# Patient Record
Sex: Female | Born: 1935 | Race: White | Hispanic: No | Marital: Single | Smoking: Never smoker
Health system: Southern US, Community
[De-identification: ages and names within clinical notes are randomized; demographics above are authoritative.]

## PROBLEM LIST (undated history)

## (undated) DIAGNOSIS — E785 Hyperlipidemia, unspecified: Secondary | ICD-10-CM

## (undated) DIAGNOSIS — R609 Edema, unspecified: Secondary | ICD-10-CM

## (undated) DIAGNOSIS — I10 Essential (primary) hypertension: Secondary | ICD-10-CM

---

## 2018-12-30 ENCOUNTER — Ambulatory Visit
Admission: EM | Admit: 2018-12-30 | Discharge: 2018-12-30 | Disposition: A | Discharge status: Home / Self Care | Payer: Medicare Other | Attending: Family Medicine | Admitting: Family Medicine

## 2018-12-30 ENCOUNTER — Ambulatory Visit: Payer: Medicare Other

## 2018-12-30 DIAGNOSIS — S8001XA Contusion of right knee, initial encounter: Secondary | ICD-10-CM | POA: Insufficient documentation

## 2018-12-30 DIAGNOSIS — S8011XA Contusion of right lower leg, initial encounter: Secondary | ICD-10-CM

## 2018-12-30 DIAGNOSIS — W19XXXA Unspecified fall, initial encounter: Secondary | ICD-10-CM | POA: Insufficient documentation

## 2018-12-30 DIAGNOSIS — E785 Hyperlipidemia, unspecified: Secondary | ICD-10-CM | POA: Insufficient documentation

## 2018-12-30 DIAGNOSIS — M11261 Other chondrocalcinosis, right knee: Secondary | ICD-10-CM | POA: Diagnosis not present

## 2018-12-30 DIAGNOSIS — I10 Essential (primary) hypertension: Secondary | ICD-10-CM | POA: Diagnosis not present

## 2018-12-30 DIAGNOSIS — Z8249 Family history of ischemic heart disease and other diseases of the circulatory system: Secondary | ICD-10-CM | POA: Diagnosis not present

## 2018-12-30 DIAGNOSIS — Z79899 Other long term (current) drug therapy: Secondary | ICD-10-CM | POA: Diagnosis not present

## 2018-12-30 DIAGNOSIS — Z7982 Long term (current) use of aspirin: Secondary | ICD-10-CM | POA: Insufficient documentation

## 2018-12-30 DIAGNOSIS — M25561 Pain in right knee: Secondary | ICD-10-CM | POA: Diagnosis present

## 2018-12-30 DIAGNOSIS — M160 Bilateral primary osteoarthritis of hip: Secondary | ICD-10-CM | POA: Insufficient documentation

## 2018-12-30 HISTORY — DX: Edema, unspecified: R60.9

## 2018-12-30 HISTORY — DX: Essential (primary) hypertension: I10

## 2018-12-30 HISTORY — DX: Hyperlipidemia, unspecified: E78.5

## 2018-12-30 MED ORDER — HYDROCODONE-ACETAMINOPHEN 5-325 MG PO TABS: ORAL_TABLET | ORAL | 0 refills | Status: AC

## 2018-12-30 NOTE — ED Provider Notes (Signed)
[facility]    CSN: [id number] Arrival date & time: 12/30/18  1119     History   Chief Complaint Chief Complaint  Patient presents with  . Fall    appt    HPI Amanda Moreno is a 85 y.o. female.   85 yo female with a c/o right knee and hip pain since falling at home 3 days ago. States she got up from her chair and walked up 3 steps and fell landing on her right side. Denies any loss of consciousness, hitting her head, palpitations, numbness/weakness, fevers, chills, chest pains.   The history is provided by the patient.    Past Medical History:  Diagnosis Date  . Edema   . Hyperlipemia   . Hypertension     There are no active problems to display for this patient.   History reviewed. No pertinent surgical history.  OB History   No obstetric history on file.      Home Medications    Prior to Admission medications   Medication Sig Start Date End Date Taking? Authorizing Provider  aspirin EC 81 MG tablet Take by mouth.   Yes [provider]  hydrochlorothiazide (HYDRODIURIL) 12.5 MG tablet TK 1 T PO ONCE A DAY 12/07/18  Yes [provider]  lisinopril (ZESTRIL) 10 MG tablet TK 1 T PO ONCE D 12/07/18  Yes [provider]  simvastatin (ZOCOR) 40 MG tablet TK 1 T PO QHS 12/18/18  Yes [provider]  HYDROcodone-acetaminophen (NORCO/VICODIN) 5-325 MG tablet 1-2 tabs po bid prn 12/30/18   Payton Mccallum, MD    Family History Family History  Problem Relation Age of Onset  . Hypertension Mother   . Hypertension Father     Social History Social History   Tobacco Use  . Smoking status: Never Smoker  . Smokeless tobacco: Never Used  Substance Use Topics  . Alcohol use: Never    Frequency: Never  . Drug use: Not on file     Allergies   Patient has no known allergies.   Review of Systems Review of Systems   Physical Exam Triage Vital Signs ED Triage Vitals  Enc Vitals Group     BP 12/30/18 1146 (!) 174/71    Pulse Rate 12/30/18 1145 78     Resp 12/30/18 1145 18     Temp 12/30/18 1145 98.2 F (36.8 C)     Temp Source 12/30/18 1145 Oral     SpO2 12/30/18 1145 99 %     Weight --      Height 12/30/18 1147 4\' 11"  (1.499 m)     Head Circumference --      Peak Flow --      Pain Score 12/30/18 1147 5     Pain Loc --      Pain Edu? --      Excl. in GC? --    No data found.  Updated Vital Signs BP (!) 174/71   Pulse 78   Temp 98.2 F (36.8 C) (Oral)   Resp 18   Ht 4\' 11"  (1.499 m)   SpO2 99%   Visual Acuity Right Eye Distance:   Left Eye Distance:   Bilateral Distance:    Right Eye Near:   Left Eye Near:    Bilateral Near:     Physical Exam Vitals signs and nursing note reviewed.  Constitutional:      General: She is not in acute distress.    Appearance: She is not  toxic-appearing or diaphoretic.  Musculoskeletal:     Right hip: She exhibits tenderness (diffuse).     Right knee: She exhibits swelling (mild). Tenderness (diffuse) found.  Neurological:     Mental Status: She is alert.      UC Treatments / Results  Labs (all labs ordered are listed, but only abnormal results are displayed) Labs Reviewed - No data to display  EKG None  Radiology Dg Knee Complete 4 Views Right  Result Date: 12/30/2018 CLINICAL DATA:  Recent fall.  Right knee pain. EXAM: RIGHT KNEE - COMPLETE 4+ VIEW COMPARISON:  None. FINDINGS: No fracture, joint effusion or dislocation. No suspicious focal osseous lesions. Chondrocalcinosis in the medial and lateral menisci. No significant degenerative arthropathy. No radiopaque foreign body. IMPRESSION: No fracture, joint effusion or malalignment in the right knee. Meniscal chondrocalcinosis, suggesting CPPD arthropathy. Electronically Signed   By: Delbert PhenixJason A Poff M.D.   On: 12/30/2018 12:25   Dg Hip Unilat With Pelvis 2-3 Views Right  Result Date: 12/30/2018 CLINICAL DATA:  Recent fall with right hip pain EXAM: DG HIP (WITH OR WITHOUT PELVIS) 2-3V RIGHT  COMPARISON:  None. FINDINGS: No pelvic fracture or diastasis. No right hip fracture or dislocation. Mild bilateral hip osteoarthritis. No suspicious focal osseous lesions. Prominent degenerative changes in the visualized lower lumbar spine. IMPRESSION: No fracture. No right hip malalignment. Mild bilateral hip osteoarthritis. Electronically Signed   By: Delbert PhenixJason A Poff M.D.   On: 12/30/2018 12:28    Procedures Procedures (including critical care time)  Medications Ordered in UC Medications - No data to display  Initial Impression / Assessment and Plan / UC Course  I have reviewed the triage vital signs and the nursing notes.  Pertinent labs & imaging results that were available during my care of the patient were reviewed by me and considered in my medical decision making (see chart for details).      Final Clinical Impressions(s) / UC Diagnoses   Final diagnoses:  Fall  Contusion of right knee, initial encounter  Contusion of right lower extremity, initial encounter  Fall, initial encounter     Discharge Instructions     Rest, ice/heat, tylenol    ED Prescriptions    Medication Sig Dispense Auth. Provider   HYDROcodone-acetaminophen (NORCO/VICODIN) 5-325 MG tablet 1-2 tabs po bid prn 6 tablet Payton Mccallumonty, Patrycja Mumpower, MD      1. x-ray results and diagnosis reviewed with patient 2. Recommend supportive treatment as above 3. Follow-up prn if symptoms worsen or don't improve  Controlled Substance Prescriptions  Controlled Substance Registry consulted? Not Applicable   Payton Mccallumonty, Detravion Tester, MD 12/30/18 [phone]

## 2018-12-30 NOTE — ED Triage Notes (Signed)
Pt states she fell on Wednesday after getting up from her recliner. Got to the first step and passed out but stated she did not lose consciousness. Now having right  leg pain

## 2018-12-30 NOTE — Discharge Instructions (Signed)
Rest, ice/heat, tylenol °

## 2019-06-01 ENCOUNTER — Other Ambulatory Visit: Payer: Self-pay | Admitting: Nephrology

## 2019-06-01 ENCOUNTER — Other Ambulatory Visit (HOSPITAL_COMMUNITY): Payer: Self-pay | Admitting: Nephrology

## 2019-06-01 DIAGNOSIS — N189 Chronic kidney disease, unspecified: Secondary | ICD-10-CM

## 2019-06-06 ENCOUNTER — Other Ambulatory Visit: Payer: Self-pay | Admitting: Family Medicine

## 2019-06-06 ENCOUNTER — Ambulatory Visit
Admission: RE | Admit: 2019-06-06 | Discharge: 2019-06-06 | Disposition: A | Discharge status: Home / Self Care | Payer: Medicare Other | Source: Ambulatory Visit | Attending: Family Medicine | Admitting: Family Medicine

## 2019-06-06 ENCOUNTER — Other Ambulatory Visit: Payer: Self-pay

## 2019-06-06 ENCOUNTER — Ambulatory Visit
Admission: RE | Admit: 2019-06-06 | Discharge: 2019-06-06 | Disposition: A | Discharge status: Home / Self Care | Payer: Medicare Other | Attending: Family Medicine | Admitting: Family Medicine

## 2019-06-06 DIAGNOSIS — M545 Low back pain, unspecified: Secondary | ICD-10-CM

## 2019-06-11 ENCOUNTER — Ambulatory Visit: Payer: Medicare Other

## 2019-06-12 ENCOUNTER — Ambulatory Visit
Admission: RE | Admit: 2019-06-12 | Discharge: 2019-06-12 | Disposition: A | Discharge status: Home / Self Care | Payer: Medicare Other | Source: Ambulatory Visit | Attending: Nephrology | Admitting: Nephrology

## 2019-06-12 ENCOUNTER — Other Ambulatory Visit: Payer: Self-pay

## 2019-06-12 ENCOUNTER — Encounter (INDEPENDENT_AMBULATORY_CARE_PROVIDER_SITE_OTHER): Payer: Self-pay

## 2019-06-12 DIAGNOSIS — N189 Chronic kidney disease, unspecified: Secondary | ICD-10-CM | POA: Diagnosis present

## 2019-10-23 IMAGING — US US RENAL
1 series · 14 of 25 positions shown · non-contrast
Comparison: None.

CLINICAL DATA: 85-year-old female with chronic renal failure.

EXAM:
RENAL / URINARY TRACT ULTRASOUND COMPLETE

[Series 1: us renal · 0.25mm/px · 14 of 29 slices shown]
[im 1/29]
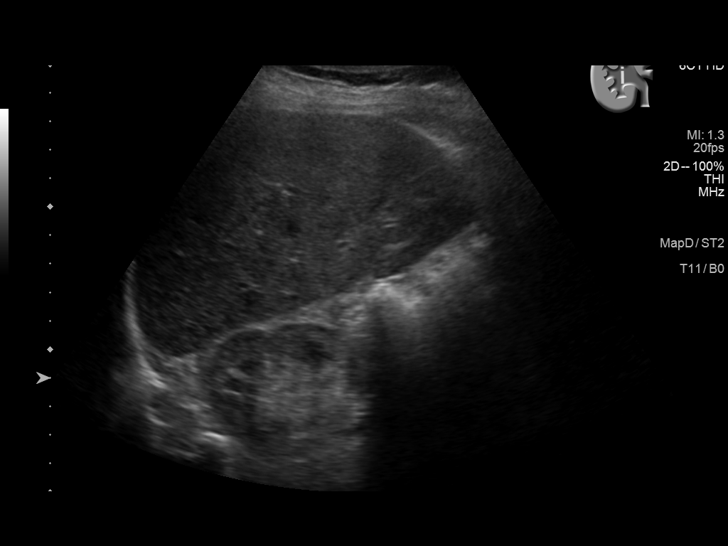
[im 3/29]
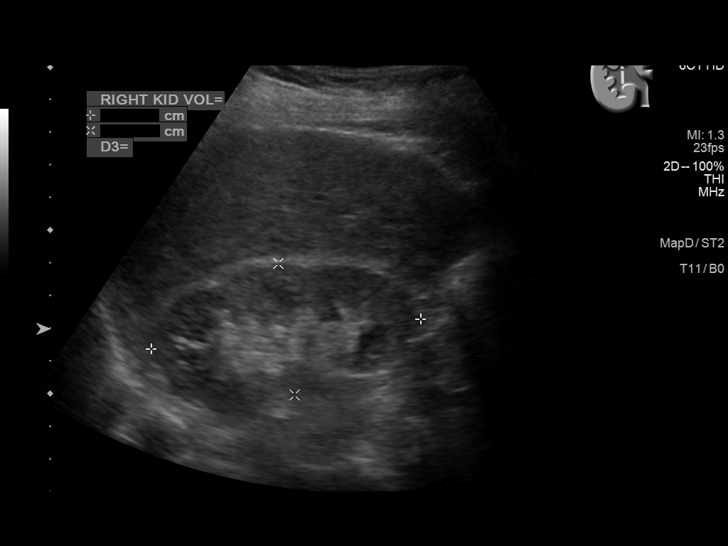
[im 5/29]
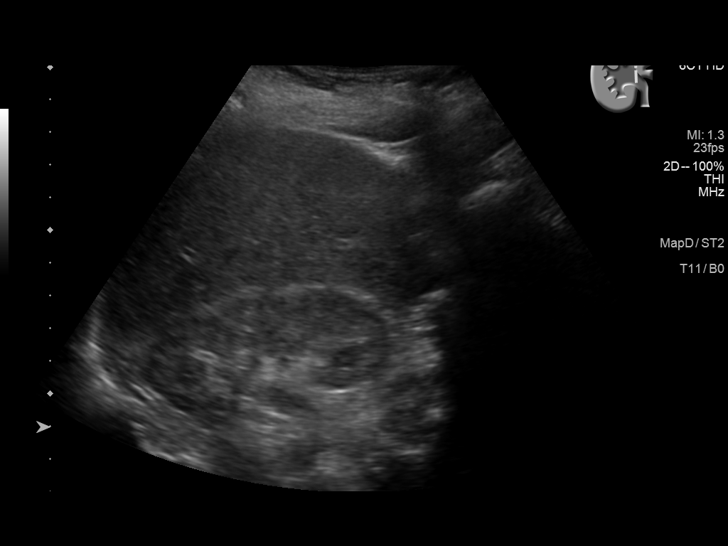
[im 8/29]
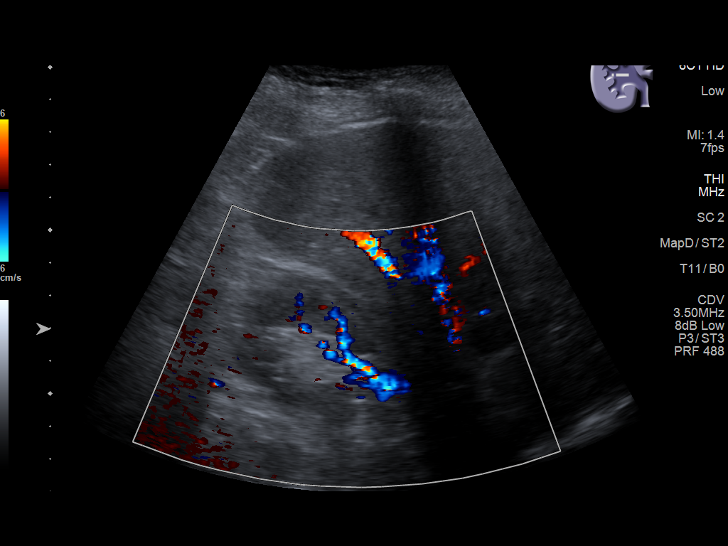
[im 10/29]
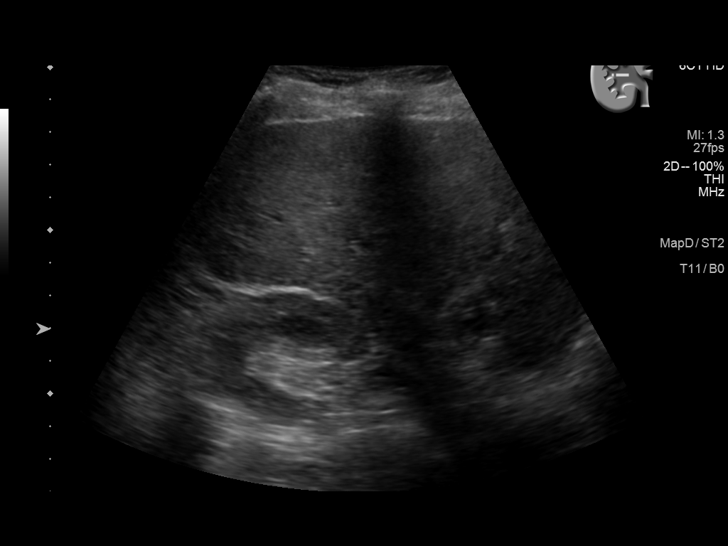
[im 11/29]
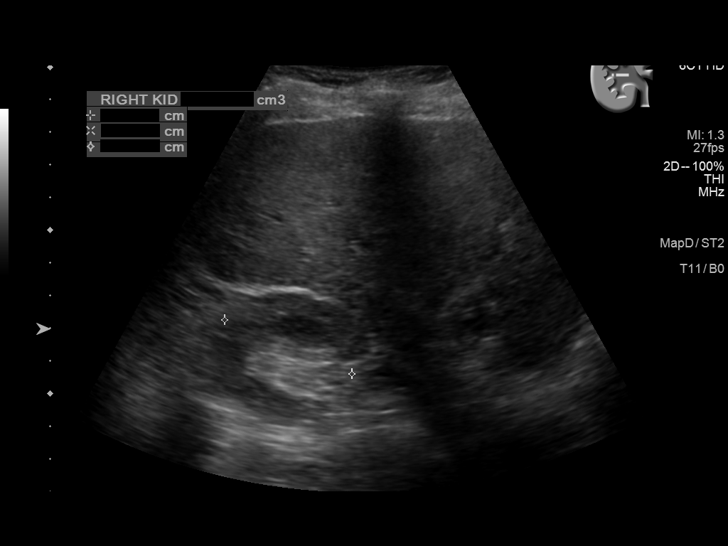
[im 13/29]
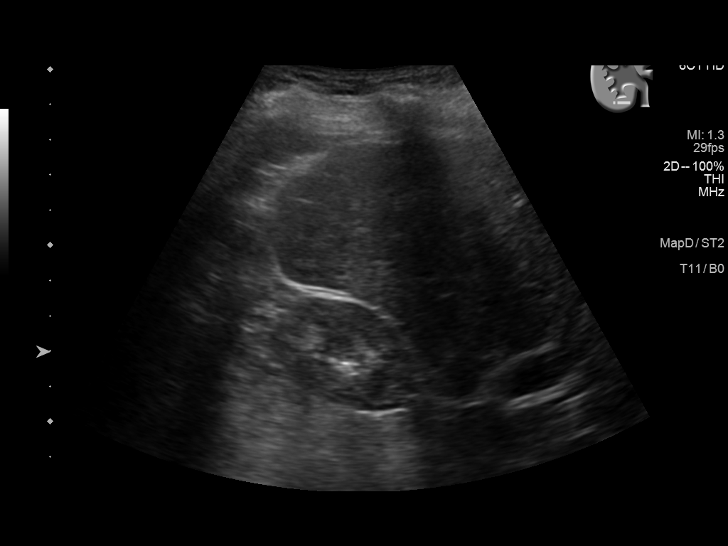
[im 16/29]
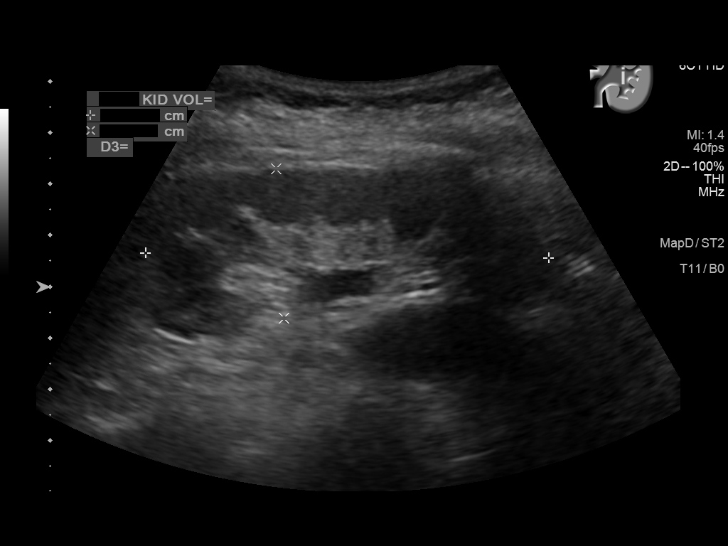
[im 18/29]
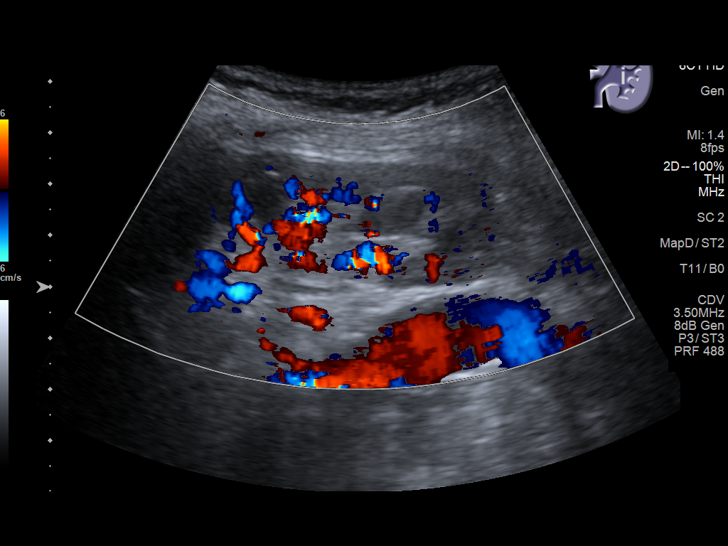
[im 19/29]
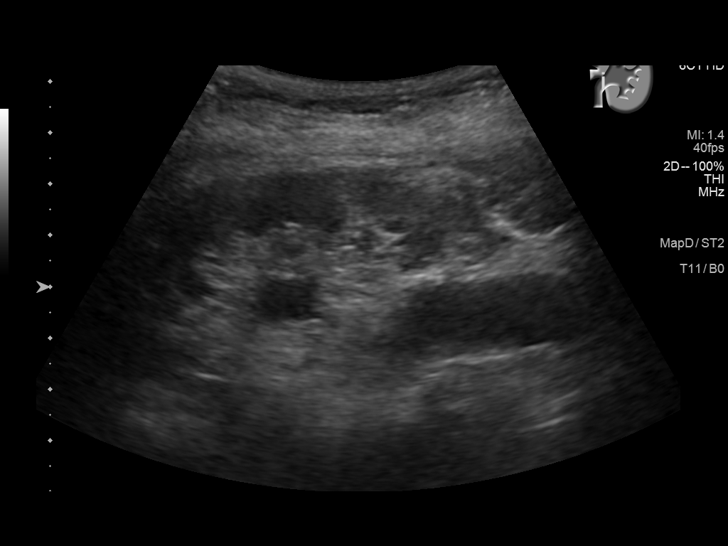
[im 22/29]
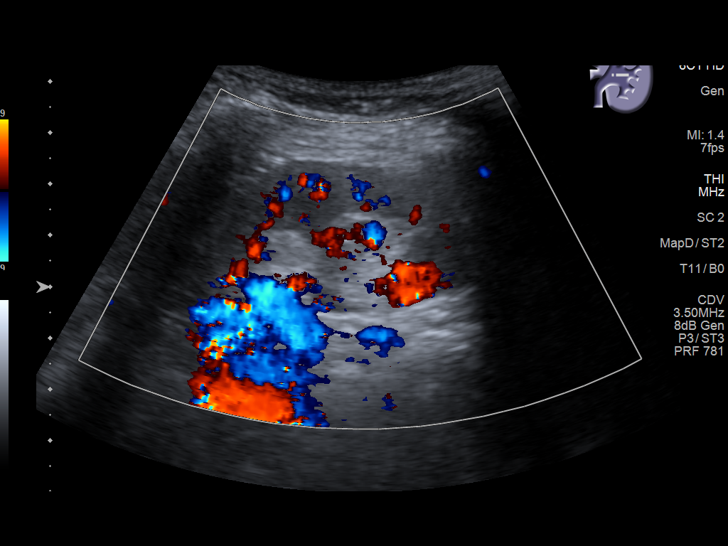
[im 24/29]
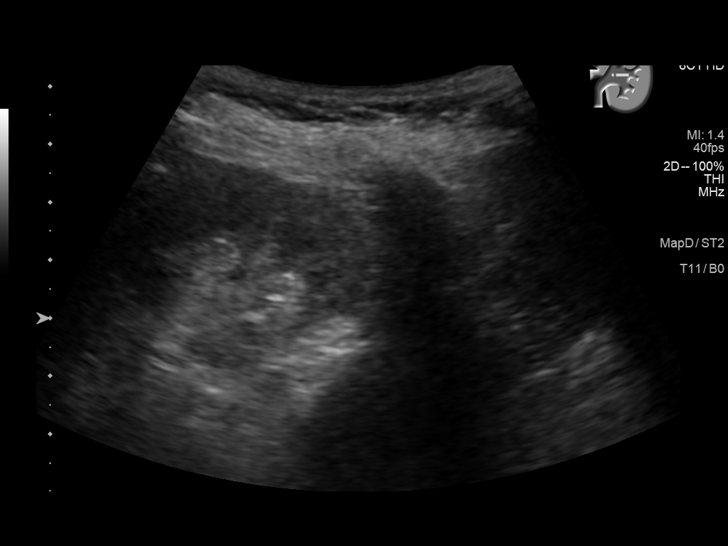
[im 26/29]
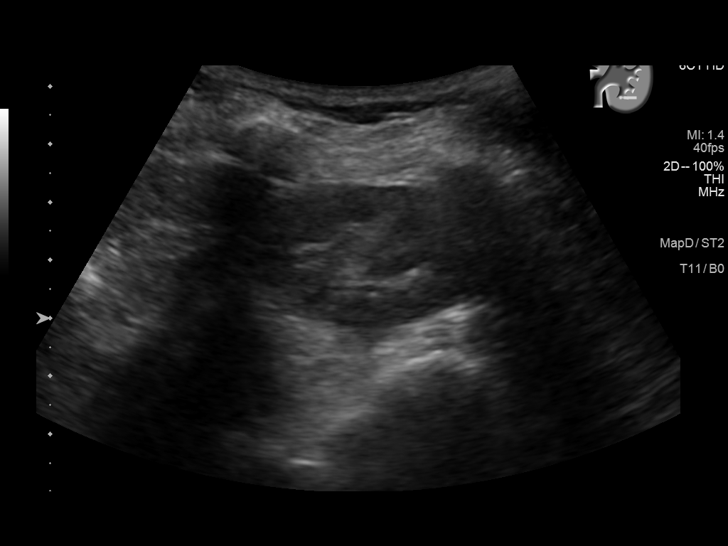
[im 29/29]
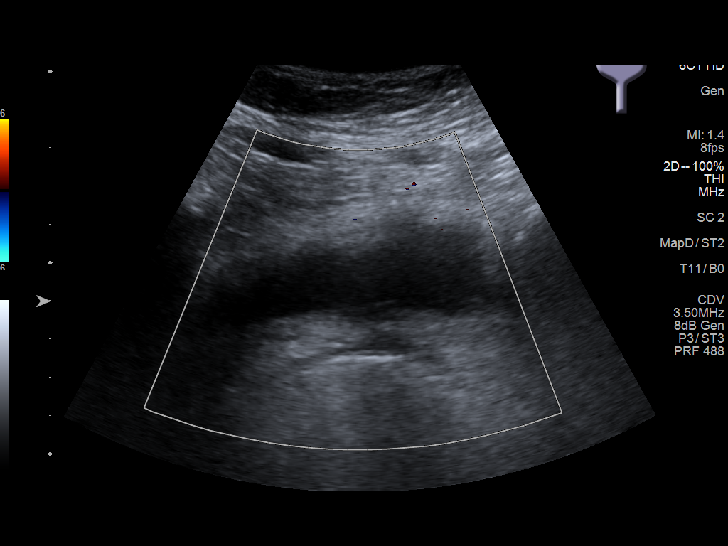

[14 of 25 positions shown; findings below may reference images not displayed]

FINDINGS: Right Kidney:

Renal measurements: 8.3 x 4.1 x 4.2 cm = volume: 74.4 mL. There is
mild increased renal echogenicity. No hydronephrosis or shadowing
stone.

Left Kidney:

Renal measurements: 7.9 x 2.9 x 3.3 cm = volume: 40 mL. Mild
increased parenchymal echogenicity. No hydronephrosis or shadowing
stone.

Bladder:

The urinary bladder is predominantly collapsed.
IMPRESSION: Mild increased renal parenchyma echogenicity in keeping with chronic
kidney disease. No hydronephrosis or shadowing stone.
